# Patient Record
Sex: Female | Born: 1960 | Race: White | Hispanic: No | Marital: Single
Health system: Southern US, Community
[De-identification: ages and names within clinical notes are randomized; demographics above are authoritative.]

---

## 1998-03-01 ENCOUNTER — Ambulatory Visit: Admission: RE | Admit: 1998-03-01 | Discharge: 1998-03-01 | Payer: Self-pay | Admitting: Internal Medicine

## 1998-03-06 ENCOUNTER — Other Ambulatory Visit: Admission: RE | Admit: 1998-03-06 | Discharge: 1998-03-06 | Payer: Self-pay | Admitting: *Deleted

## 1999-01-01 ENCOUNTER — Other Ambulatory Visit: Admission: RE | Admit: 1999-01-01 | Discharge: 1999-01-01 | Payer: Self-pay | Admitting: Obstetrics and Gynecology

## 1999-10-09 ENCOUNTER — Other Ambulatory Visit: Admission: RE | Admit: 1999-10-09 | Discharge: 1999-10-09 | Payer: Self-pay | Admitting: Obstetrics and Gynecology

## 2000-01-29 ENCOUNTER — Encounter: Payer: Self-pay | Admitting: Sports Medicine

## 2000-01-29 ENCOUNTER — Ambulatory Visit (HOSPITAL_COMMUNITY): Admission: RE | Admit: 2000-01-29 | Discharge: 2000-01-29 | Payer: Self-pay | Admitting: Sports Medicine

## 2000-12-14 ENCOUNTER — Other Ambulatory Visit: Admission: RE | Admit: 2000-12-14 | Discharge: 2000-12-14 | Payer: Self-pay | Admitting: Obstetrics and Gynecology

## 2001-12-12 ENCOUNTER — Other Ambulatory Visit: Admission: RE | Admit: 2001-12-12 | Discharge: 2001-12-12 | Payer: Self-pay | Admitting: Obstetrics and Gynecology

## 2002-02-27 ENCOUNTER — Encounter: Payer: Self-pay | Admitting: Chiropractic Medicine

## 2002-02-27 ENCOUNTER — Encounter: Admission: RE | Admit: 2002-02-27 | Discharge: 2002-02-27 | Payer: Self-pay | Admitting: Chiropractic Medicine

## 2002-02-28 ENCOUNTER — Encounter: Admission: RE | Admit: 2002-02-28 | Discharge: 2002-02-28 | Payer: Self-pay | Admitting: Chiropractic Medicine

## 2002-02-28 ENCOUNTER — Encounter: Payer: Self-pay | Admitting: Chiropractic Medicine

## 2002-12-18 ENCOUNTER — Other Ambulatory Visit: Admission: RE | Admit: 2002-12-18 | Discharge: 2002-12-18 | Payer: Self-pay | Admitting: Obstetrics and Gynecology

## 2003-11-06 ENCOUNTER — Encounter: Admission: RE | Admit: 2003-11-06 | Discharge: 2004-02-04 | Payer: Self-pay | Admitting: Family Medicine

## 2003-12-18 ENCOUNTER — Other Ambulatory Visit: Admission: RE | Admit: 2003-12-18 | Discharge: 2003-12-18 | Payer: Self-pay | Admitting: Obstetrics and Gynecology

## 2004-12-24 ENCOUNTER — Other Ambulatory Visit: Admission: RE | Admit: 2004-12-24 | Discharge: 2004-12-24 | Payer: Self-pay | Admitting: Obstetrics and Gynecology

## 2005-05-13 ENCOUNTER — Ambulatory Visit (HOSPITAL_COMMUNITY): Admission: RE | Admit: 2005-05-13 | Discharge: 2005-05-13 | Payer: Self-pay | Admitting: Interventional Cardiology

## 2005-12-31 ENCOUNTER — Other Ambulatory Visit: Admission: RE | Admit: 2005-12-31 | Discharge: 2005-12-31 | Payer: Self-pay | Admitting: Obstetrics and Gynecology

## 2009-03-21 ENCOUNTER — Emergency Department: Payer: Self-pay | Admitting: Emergency Medicine

## 2009-12-16 ENCOUNTER — Ambulatory Visit: Payer: Self-pay | Admitting: Obstetrics and Gynecology

## 2009-12-17 ENCOUNTER — Ambulatory Visit: Payer: Self-pay | Admitting: Obstetrics and Gynecology

## 2009-12-18 ENCOUNTER — Ambulatory Visit: Payer: Self-pay | Admitting: Obstetrics and Gynecology

## 2013-05-08 ENCOUNTER — Ambulatory Visit: Payer: Self-pay | Admitting: Obstetrics and Gynecology

## 2013-05-08 LAB — URINALYSIS, COMPLETE
Bacteria: NONE SEEN
Bilirubin,UR: NEGATIVE
Glucose,UR: NEGATIVE mg/dL (ref 0–75)
Ketone: NEGATIVE
Leukocyte Esterase: NEGATIVE
Nitrite: NEGATIVE
Ph: 5 (ref 4.5–8.0)
Protein: NEGATIVE
RBC,UR: <1 /HPF (ref 0–5)
Specific Gravity: 1.025 (ref 1.003–1.030)
Squamous Epithelial: <1
WBC UR: <1 /HPF (ref 0–5)

## 2013-05-08 LAB — HEMOGLOBIN: HGB: 13.6 g/dL (ref 12.0–16.0)

## 2013-05-11 LAB — PATHOLOGY REPORT

## 2014-11-30 NOTE — Op Note (Signed)
PATIENT NAME:  Pamela Rollins, Pamela Rollins MR#:  [id number] DATE OF BIRTH:  March 15, 1961  DATE OF PROCEDURE:  05/08/2013  PREOPERATIVE DIAGNOSIS: Solid left adnexal mass.   POSTOPERATIVE DIAGNOSIS: Solid left adnexal mass, grossly consistent with fibroma.   PROCEDURES: 1. Diagnostic laparoscopy.  2. Left salpingo-oophorectomy.   SURGEON: Ricky L. Eutha Cude.   ASSISTANT: Jennell Cornerhomas Schermerhorn, M.Rollins.   ANESTHESIA: General endotracheal.   FINDINGS: Large fibrous mass replacing the left ovary. Grossly normal uterus. Grossly normal right tube and ovary.   ESTIMATED BLOOD LOSS: Minimal.   COMPLICATIONS: None.   DRAINS: In and out catheter at the beginning of the case  100 mL of clear urine.  PROCEDURE IN DETAIL: The patient was consented. Preoperative assessment was completed. Taken in the operating room and placed in the supine position where anesthesia was initiated. She was placed in the dorsal lithotomy position using Allen stirrups, prepped and draped in the usual sterile fashion. The cervix was visualized. Hulka tenaculum was placed. The bladder was drained.   An 11 port was placed infraumbilically and pneumoperitoneum established and the patient placed in the Trendelenburg position and 11 port in the left lower quadrant and 5 in the right lower quadrant and with the aid of atraumatic graspers, I inspected the pelvis with findings as noted above.   Ace Harmonic scalpel was then used to divide the infundibulopelvic and go through the broad ligament and the left tube and ovary were excised free. Reusable bag was inserted. Ovary was brought up to the level of the port, incision was extended to approximately 3.5 cm and using power morcellator and manual morcellation removed the left tube and ovary which was sent for permanent pathology.   Pelvis was inspected both before and after lowering the pressure to 6 mmHg and the areas were seen to be hemostatic. At this point, the procedure was felt to achieve  maximum efficacy. Ports were removed and pneumoperitoneum was allowed to resolve. The patient was returned to the supine position and was taken out of Trendelenburg position, and the incisions were closed with deep of 0, subcutaneous with 3-0 Vicryl. Steri-Strips and Band-Aids were placed.   The patient tolerated the procedure well. All instrument, needle, and sponge counts were correct.  Anticipate a routine postoperative course.    ____________________________ Reatha Harpsicky L. Logan BoresEvans, MD rle:sg Rollins: 05/08/2013 08:40:21 ET T: 05/08/2013 09:36:52 ET JOB#: [id number]  cc: Clide Clifficky L. Logan BoresEvans, MD, <Dictator> Augustina MoodICK L Mclean Moya MD ELECTRONICALLY SIGNED 05/08/2013 10:24

## 2015-02-18 ENCOUNTER — Other Ambulatory Visit: Payer: Self-pay | Admitting: Family Medicine

## 2015-02-18 DIAGNOSIS — Z1231 Encounter for screening mammogram for malignant neoplasm of breast: Secondary | ICD-10-CM

## 2015-02-19 ENCOUNTER — Ambulatory Visit
Admission: RE | Admit: 2015-02-19 | Discharge: 2015-02-19 | Disposition: A | Discharge status: Home / Self Care | Payer: BC Managed Care – PPO | Source: Ambulatory Visit | Attending: Family Medicine | Admitting: Family Medicine

## 2015-02-19 DIAGNOSIS — Z1231 Encounter for screening mammogram for malignant neoplasm of breast: Secondary | ICD-10-CM | POA: Insufficient documentation

## 2015-02-20 ENCOUNTER — Other Ambulatory Visit: Payer: Self-pay | Admitting: Family Medicine

## 2015-02-20 DIAGNOSIS — N6489 Other specified disorders of breast: Secondary | ICD-10-CM

## 2015-02-20 DIAGNOSIS — R928 Other abnormal and inconclusive findings on diagnostic imaging of breast: Secondary | ICD-10-CM

## 2015-02-21 ENCOUNTER — Ambulatory Visit: Payer: BC Managed Care – PPO

## 2015-02-22 ENCOUNTER — Ambulatory Visit
Admission: RE | Admit: 2015-02-22 | Discharge: 2015-02-22 | Disposition: A | Discharge status: Home / Self Care | Payer: BC Managed Care – PPO | Source: Ambulatory Visit | Attending: Family Medicine | Admitting: Family Medicine

## 2015-02-22 DIAGNOSIS — R928 Other abnormal and inconclusive findings on diagnostic imaging of breast: Secondary | ICD-10-CM

## 2015-02-22 DIAGNOSIS — N6489 Other specified disorders of breast: Secondary | ICD-10-CM | POA: Insufficient documentation

## 2015-02-28 ENCOUNTER — Other Ambulatory Visit: Payer: Self-pay | Admitting: Family Medicine

## 2015-02-28 ENCOUNTER — Inpatient Hospital Stay
Admission: RE | Admit: 2015-02-28 | Discharge: 2015-02-28 | Disposition: A | Discharge status: Home / Self Care | Payer: Self-pay | Source: Ambulatory Visit | Attending: Family Medicine | Admitting: Family Medicine

## 2015-02-28 DIAGNOSIS — Z7689 Persons encountering health services in other specified circumstances: Secondary | ICD-10-CM

## 2015-03-18 ENCOUNTER — Other Ambulatory Visit: Payer: Self-pay | Admitting: Family Medicine

## 2015-03-18 DIAGNOSIS — R928 Other abnormal and inconclusive findings on diagnostic imaging of breast: Secondary | ICD-10-CM

## 2015-09-12 ENCOUNTER — Other Ambulatory Visit: Payer: BC Managed Care – PPO

## 2015-09-12 ENCOUNTER — Ambulatory Visit: Payer: BC Managed Care – PPO | Attending: Family Medicine

## 2015-12-19 IMAGING — US US BREAST*L* LIMITED INC AXILLA
1 series · 2 of 2 positions shown · non-contrast
Comparison: Previous exam(s).

ADDENDUM:
Patient's prior exams have become available comparison. Small
opacity noted in the left breast on the study dated 02/19/2015 does
not clearly present on prior exams. Short-term follow-up is still
recommended. No change to the recommendation or BI-RADS category or
impression.
CLINICAL DATA: Call back from screening for a possible mass in the
left breast.

EXAM:
DIGITAL DIAGNOSTIC LEFT MAMMOGRAM WITH 3D TOMOSYNTHESIS WITH CAD
ULTRASOUND LEFT BREAST

[Series 1: us breast*left* limited inc axilla · 0.08mm/px · 2 of 2 slices shown]
[im 1/2]
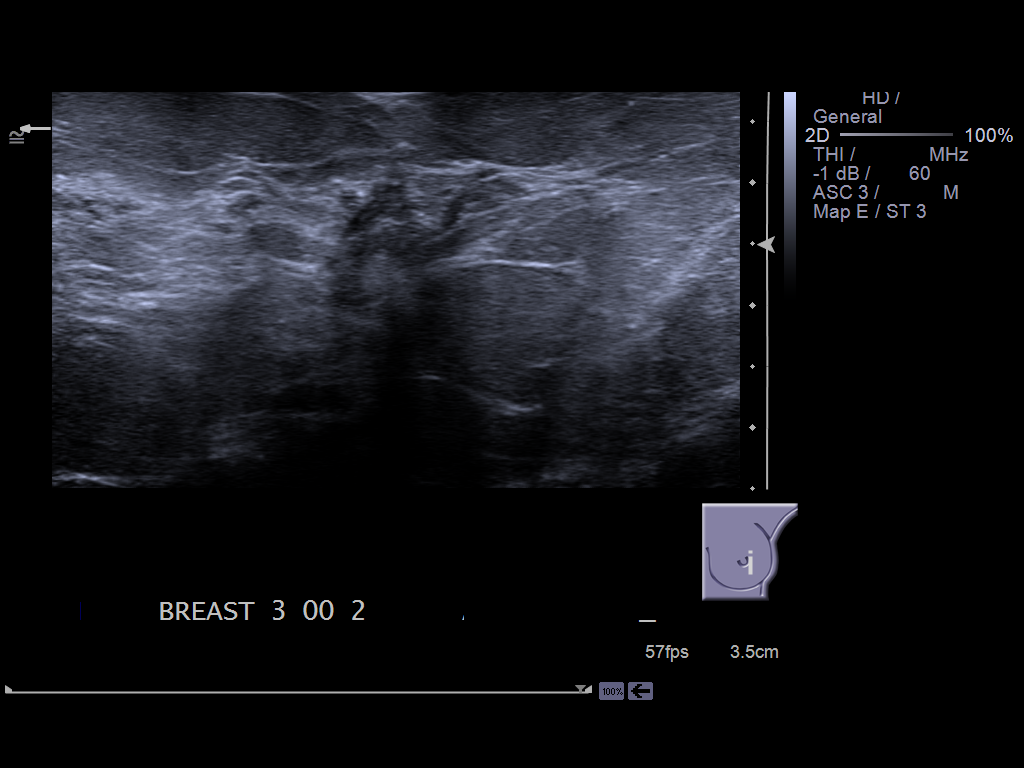
[im 2/2]
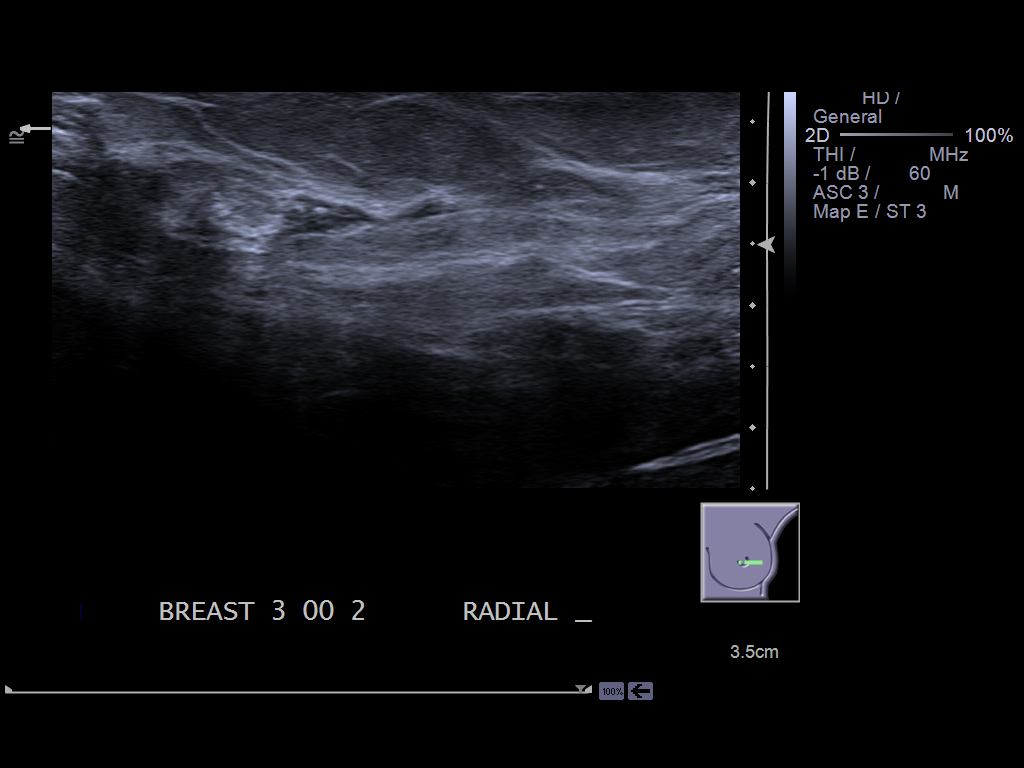

[2 of 2 positions shown; findings below may reference images not displayed]

ACR Breast Density Category b: There are scattered areas of
fibroglandular density.
FINDINGS: On the 3D images, mass appears is a small fusiform opacity in the
lateral breast, anterior [DATE]. Margins are circumscribed. No other
abnormality is seen on the diagnostic left breast images.

Mammographic images were processed with CAD.

Targeted ultrasound is performed, showing there is a duct with a
focal fusiform dilation in the 3 o'clock position of the left
breast, 2 cm the nipple, which corresponds shape, size and location
to the mammographic abnormality. No suspicious lesion is seen
sonographically.
IMPRESSION: Probably benign focally dilated duct in the left breast. Short-term
follow-up recommended.

RECOMMENDATION:
Repeat left breast diagnostic mammography and ultrasound in 6
months.

I have discussed the findings and recommendations with the patient.
Results were also provided in writing at the conclusion of the
visit. If applicable, a reminder letter will be sent to the patient
regarding the next appointment.

BI-RADS CATEGORY  3: Probably benign.
